# Patient Record
Sex: Female | Born: 1950 | Race: White | Hispanic: No | Marital: Married | Smoking: Never smoker
Health system: Southern US, Community
[De-identification: ages and names within clinical notes are randomized; demographics above are authoritative.]

## PROBLEM LIST (undated history)

## (undated) DIAGNOSIS — K5790 Diverticulosis of intestine, part unspecified, without perforation or abscess without bleeding: Secondary | ICD-10-CM

---

## 2019-03-02 ENCOUNTER — Emergency Department (HOSPITAL_BASED_OUTPATIENT_CLINIC_OR_DEPARTMENT_OTHER)
Admission: EM | Admit: 2019-03-02 | Discharge: 2019-03-02 | Disposition: A | Discharge status: Home / Self Care | Payer: Medicare Other | Attending: Emergency Medicine | Admitting: Emergency Medicine

## 2019-03-02 ENCOUNTER — Encounter (HOSPITAL_BASED_OUTPATIENT_CLINIC_OR_DEPARTMENT_OTHER): Payer: Self-pay | Admitting: Student

## 2019-03-02 ENCOUNTER — Other Ambulatory Visit: Payer: Self-pay

## 2019-03-02 ENCOUNTER — Emergency Department (HOSPITAL_BASED_OUTPATIENT_CLINIC_OR_DEPARTMENT_OTHER): Payer: Medicare Other

## 2019-03-02 DIAGNOSIS — K449 Diaphragmatic hernia without obstruction or gangrene: Secondary | ICD-10-CM | POA: Diagnosis not present

## 2019-03-02 DIAGNOSIS — R1032 Left lower quadrant pain: Secondary | ICD-10-CM | POA: Insufficient documentation

## 2019-03-02 HISTORY — DX: Diverticulosis of intestine, part unspecified, without perforation or abscess without bleeding: K57.90

## 2019-03-02 LAB — CBC WITH DIFFERENTIAL/PLATELET
Abs Immature Granulocytes: 0.03 10*3/uL (ref 0.00–0.07)
Basophils Absolute: 0 10*3/uL (ref 0.0–0.1)
Basophils Relative: 0 %
Eosinophils Absolute: 0.1 10*3/uL (ref 0.0–0.5)
Eosinophils Relative: 1 %
HCT: 46.9 % — ABNORMAL HIGH (ref 36.0–46.0)
Hemoglobin: 14.9 g/dL (ref 12.0–15.0)
Immature Granulocytes: 0 %
Lymphocytes Relative: 27 %
Lymphs Abs: 2.5 10*3/uL (ref 0.7–4.0)
MCH: 28.3 pg (ref 26.0–34.0)
MCHC: 31.8 g/dL (ref 30.0–36.0)
MCV: 89 fL (ref 80.0–100.0)
Monocytes Absolute: 0.5 10*3/uL (ref 0.1–1.0)
Monocytes Relative: 6 %
Neutro Abs: 6 10*3/uL (ref 1.7–7.7)
Neutrophils Relative %: 66 %
Platelets: 262 10*3/uL (ref 150–400)
RBC: 5.27 MIL/uL — ABNORMAL HIGH (ref 3.87–5.11)
RDW: 13 % (ref 11.5–15.5)
WBC: 9.2 10*3/uL (ref 4.0–10.5)
nRBC: 0 % (ref 0.0–0.2)

## 2019-03-02 LAB — COMPREHENSIVE METABOLIC PANEL
ALT: 23 U/L (ref 0–44)
AST: 21 U/L (ref 15–41)
Albumin: 4.3 g/dL (ref 3.5–5.0)
Alkaline Phosphatase: 59 U/L (ref 38–126)
Anion gap: 8 (ref 5–15)
BUN: 16 mg/dL (ref 8–23)
CO2: 27 mmol/L (ref 22–32)
Calcium: 9.1 mg/dL (ref 8.9–10.3)
Chloride: 105 mmol/L (ref 98–111)
Creatinine, Ser: 1.03 mg/dL — ABNORMAL HIGH (ref 0.44–1.00)
GFR calc Af Amer: >60 mL/min (ref >60)
GFR calc non Af Amer: 56 mL/min — ABNORMAL LOW (ref >60)
Glucose, Bld: 96 mg/dL (ref 70–99)
Potassium: 3.8 mmol/L (ref 3.5–5.1)
Sodium: 140 mmol/L (ref 135–145)
Total Bilirubin: 1.1 mg/dL (ref 0.3–1.2)
Total Protein: 7.5 g/dL (ref 6.5–8.1)

## 2019-03-02 LAB — URINALYSIS, ROUTINE W REFLEX MICROSCOPIC
Bilirubin Urine: NEGATIVE
Glucose, UA: NEGATIVE mg/dL
Hgb urine dipstick: NEGATIVE
Ketones, ur: NEGATIVE mg/dL
Leukocytes,Ua: NEGATIVE
Nitrite: NEGATIVE
Protein, ur: NEGATIVE mg/dL
Specific Gravity, Urine: >1.03 — ABNORMAL HIGH (ref 1.005–1.030)
pH: 5.5 (ref 5.0–8.0)

## 2019-03-02 LAB — LIPASE, BLOOD: Lipase: 41 U/L (ref 11–51)

## 2019-03-02 MED ORDER — ONDANSETRON HCL 4 MG/2ML IJ SOLN
4.0000 mg | Freq: Once | INTRAMUSCULAR | Status: AC
  Administered 2019-03-02: 17:00:00 4 mg via INTRAVENOUS
  Filled 2019-03-02: qty 2

## 2019-03-02 MED ORDER — METRONIDAZOLE 500 MG PO TABS: 500.0000 mg | ORAL_TABLET | Freq: Two times a day (BID) | ORAL | 0 refills | Status: AC

## 2019-03-02 MED ORDER — SODIUM CHLORIDE 0.9 % IV BOLUS
1000.0000 mL | Freq: Once | INTRAVENOUS | Status: AC
  Administered 2019-03-02: 1000 mL via INTRAVENOUS

## 2019-03-02 MED ORDER — HYDROCODONE-ACETAMINOPHEN 5-325 MG PO TABS: 1.0000 | ORAL_TABLET | Freq: Four times a day (QID) | ORAL | 0 refills | Status: AC | PRN

## 2019-03-02 MED ORDER — FENTANYL CITRATE (PF) 100 MCG/2ML IJ SOLN
25.0000 ug | Freq: Once | INTRAMUSCULAR | Status: AC
  Administered 2019-03-02: 17:00:00 25 ug via INTRAVENOUS
  Filled 2019-03-02: qty 2

## 2019-03-02 MED ORDER — IOHEXOL 300 MG/ML  SOLN
100.0000 mL | Freq: Once | INTRAMUSCULAR | Status: AC | PRN
  Administered 2019-03-02: 100 mL via INTRAVENOUS

## 2019-03-02 NOTE — ED Triage Notes (Signed)
Pt here after course of antibiotics for ear infection and she is now experiencing diarrhea and abd pain x 3 weeks

## 2019-03-02 NOTE — Discharge Instructions (Addendum)
You are seen in the emergency department today for abdominal pain.  Your labs were all fairly normal with the exception that your creatinine was a bit high, this may be due to dehydration, this should be rechecked by primary care within 1 to 2 weeks.  Your CT scan did not show obvious diverticulitis, this may be very mild or early diverticulitis.  We are placing you on Flagyl to take twice per day for the next 5 days.  Do not drink alcohol with this medication as it is severely dangerous.  We also send you home with Percocet.with severe pain.  -Percocet-this is a narcotic/controlled substance medication that has potential addicting qualities.  We recommend that you take 1-2 tablets every 6 hours as needed for severe pain.  Do not drive or operate heavy machinery when taking this medicine as it can be sedating. Do not drink alcohol or take other sedating medications when taking this medicine for safety reasons.  Keep this out of reach of small children.  Please be aware this medicine has Tylenol in it (325 mg/tab) do not exceed the maximum dose of Tylenol in a day per over the counter recommendations should you decide to supplement with Tylenol over the counter.   Please follow attached diet guidelines to help with diarrhea.  Please follow-up with your GI doctor within 3 days.  Return to the ER for new or worsening symptoms including but not limited to fever, bloody diarrhea, inability to keep fluids down, change in severity or quality of pain, or any other concerns.

## 2019-03-02 NOTE — ED Notes (Signed)
CT awaiting results of lab work prior to imaging with iv contrast per protocol

## 2019-03-02 NOTE — ED Notes (Signed)
Patient transported to CT 

## 2019-03-02 NOTE — ED Provider Notes (Signed)
[facility] EMERGENCY DEPARTMENT Provider Note   CSN: [id number] Arrival date & time: 03/02/19  1551    History   Chief Complaint Chief Complaint  Patient presents with   Abdominal Pain   Diarrhea    HPI Taylor Villanueva is a 68 y.o. female with a hx of HTN, hyperlipidemia, SAH, chronic back pain, diverticulosis, and prior c-section & cholecystectomy who presents to the ED with complaints of abdominal pain x 2-3 weeks. Patient states pain is located to the generalized abdomen w/ increased pain to the LLQ, initially pain was intermittent primarily after eating, over the past 48 hours has been constant. Current discomfort is an 8/10 in severity other than eating no alleviating/aggravating. No medications tried PTA. Notes associated watery diarrhea with several episodes per day, non bloody. Sxs onset after taking a few days of unknown abx (thinks this may have been augmentin) started by urgent care for possible ear infection- wax occluding TM, ultimately saw ENT- no signs of infection abx was discontinued. Has had continued sxs since stopping abx. Called her GI doctor today and was instructed to come to the ED. Has had some nausea which she attributes to severe pain at times, but no vomiting. Denies fever, chills, emesis, melena, hematochezia, or dysuria. No recent foreign travel of suspicious fluid/food intake. Hx of diverticulosis thinks this may be similar to prior diverticulitis.      HPI  Past Medical History:  Diagnosis Date   Diverticulosis     There are no active problems to display for this patient.   History reviewed. No pertinent surgical history.   OB History   No obstetric history on file.      Home Medications    Prior to Admission medications   Not on File    Family History No family history on file.  Social History Social History   Tobacco Use   Smoking status: Not on file  Substance Use Topics   Alcohol use: Not on file   Drug use:  Not on file     Allergies   Patient has no allergy information on record.   Review of Systems Review of Systems  Constitutional: Negative for chills and fever.  Respiratory: Negative for cough and shortness of breath.   Cardiovascular: Negative for chest pain.  Gastrointestinal: Positive for abdominal pain, diarrhea and nausea. Negative for anal bleeding, blood in stool, constipation and vomiting.  Genitourinary: Negative for dysuria and urgency.  All other systems reviewed and are negative.    Physical Exam Updated Vital Signs BP (!) 150/99    Pulse 73    Temp 98.9 F (37.2 C) (Oral)    LMP  (LMP Unknown)    SpO2 98%  RR: 16 breaths per minute.    Physical Exam Vitals signs and nursing note reviewed.  Constitutional:      General: She is not in acute distress.    Appearance: She is well-developed. She is not toxic-appearing.  HENT:     Head: Normocephalic and atraumatic.  Eyes:     General:        Right eye: No discharge.        Left eye: No discharge.     Conjunctiva/sclera: Conjunctivae normal.  Neck:     Musculoskeletal: Neck supple.  Cardiovascular:     Rate and Rhythm: Normal rate and regular rhythm.  Pulmonary:     Effort: Pulmonary effort is normal. No respiratory distress.     Breath sounds: Normal breath sounds. No wheezing,  rhonchi or rales.  Abdominal:     General: There is no distension.     Palpations: Abdomen is soft.     Tenderness: There is abdominal tenderness (moderate generalized w/ increased tenderness to the LLQ). There is no guarding or rebound.  Skin:    General: Skin is warm and dry.     Findings: No rash.  Neurological:     Mental Status: She is alert.     Comments: Clear speech.   Psychiatric:        Behavior: Behavior normal.    ED Treatments / Results  Labs (all labs ordered are listed, but only abnormal results are displayed) Labs Reviewed  CBC WITH DIFFERENTIAL/PLATELET - Abnormal; Notable for the following components:       Result Value   RBC 5.27 (*)    HCT 46.9 (*)    All other components within normal limits  COMPREHENSIVE METABOLIC PANEL - Abnormal; Notable for the following components:   Creatinine, Ser 1.03 (*)    GFR calc non Af Amer 56 (*)    All other components within normal limits  URINALYSIS, ROUTINE W REFLEX MICROSCOPIC - Abnormal; Notable for the following components:   Specific Gravity, Urine >1.030 (*)    All other components within normal limits  LIPASE, BLOOD    EKG None  Radiology Ct Abdomen Pelvis W Contrast  Result Date: 03/02/2019 CLINICAL DATA:  Abdominal pain and diarrhea. History of diverticulitis. EXAM: CT ABDOMEN AND PELVIS WITH CONTRAST TECHNIQUE: Multidetector CT imaging of the abdomen and pelvis was performed using the standard protocol following bolus administration of intravenous contrast. CONTRAST:  OMNIPAQUE IOHEXOL 300 MG/ML  SOLN COMPARISON:  None. FINDINGS: Lower chest: Lung bases are clear. Hepatobiliary: No focal hepatic lesion. Postcholecystectomy. No biliary dilatation. Pancreas: Pancreas is normal. No ductal dilatation. No pancreatic inflammation. Spleen: Normal spleen Adrenals/urinary tract: Adrenal glands and kidneys are normal. The ureters and bladder normal. Stomach/Bowel: Moderate hiatal hernia. Stomach, duodenum small-bowel normal. Appendix normal. Several scattered diverticula ascending and transverse colon. Scattered diverticula descending colon. Multiple diverticula through the sigmoid region. The sigmoid colon is mildly thickened likely secondary to nondistention. No clear evidence acute diverticulitis. Vascular/Lymphatic: Abdominal aorta is normal caliber with atherosclerotic calcification. There is no retroperitoneal or periportal lymphadenopathy. No pelvic lymphadenopathy. Reproductive: Round peripherally enhancing lesion within the uterine body measuring 19 mm is presumed a enhancing leiomyoma. Other: No free fluid. Musculoskeletal: No aggressive osseous  lesion. Posterior lumbar fusion. Spinal stimulation leads in the the central canal. IMPRESSION: 1. Multiple diverticula through the sigmoid colon region without clear evidence of acute diverticulitis. Difficult to exclude very early or very mild diverticulitis. 2. Peripherally enhancing round lesion in the uterine body is presumed a leiomyoma. 3.  Aortic Atherosclerosis (ICD10-I70.0). 4. Moderate hiatal hernia. Electronically Signed   By: Genevive Bi M.D.   On: 03/02/2019 18:27    Procedures Procedures (including critical care time)  Medications Ordered in ED Medications - No data to display   Initial Impression / Assessment and Plan / ED Course  I have reviewed the triage vital signs and the nursing notes.  Pertinent labs & imaging results that were available during my care of the patient were reviewed by me and considered in my medical decision making (see chart for details).   Patient presents to the ED w/ complaints of abdominal pain associated with watery diarrhea x 2-3 weeks. Nontoxic appearing, no apparent distress, vitals WNL with the exception of mildly elevated BP- doubt HTN emergency, PCP  recheck. Exam w/ moderate generalized abdominal tenderness to palpation w/ increased tenderness to the LLQ, no peritoneal signs. DDX: Diverticulitis,colitis, bowel perf/obstruction, appendicitis, pancreatitis, also considering c. Diff given recent abx but seems somewhat less likely. Plan for labs & CT abdomen/pelvis. Analgesics, anti-emetics, & fluids ordered.   Work-up reviewed:  CBC: No leukocytosis or significant anemia CMP: Mild elevation in creatinine, no old on record for comparison, PCP recheck.  No significant electrolyte disturbance.  LFTs WNL. Lipase: Within normal limits Urinalysis: No signs of infection, elevated specific gravity consistent with dehydration, receiving fluids in the ER. C. difficile testing: Patient unable to provide sample in the emergency department, feel this is  somewhat less likely. CT scan: Difficult to exclude very early or very mild diverticulitis, there are also findings of a fibroid as well as aortic atherosclerosis and a hiatal hernia.  Findings and plan of care discussed with supervising physician Dr. Silverio LayYao who has evaluated patient- recommends discharge home with 500 mg twice daily Flagyl for 5 days. Will also provide a few tablets of hydrocodone for pain control. [place] Cont[place] Sub[place] System queried, prescription printed secondary to unsure if pharmacy is open that patient utilizes. Follow up with her GI doctor. I discussed results, treatment plan, need for follow-up, and return precautions with the patient. Provided opportunity for questions, patient confirmed understanding and is in agreement with plan.     Final Clinical Impressions(s) / ED Diagnoses   Final diagnoses:  Left lower quadrant abdominal pain    ED Discharge Orders         Ordered    metroNIDAZOLE (FLAGYL) 500 MG tablet  2 times daily     03/02/19 1919    HYDROcodone-acetaminophen (NORCO/VICODIN) 5-325 MG tablet  Every 6 hours PRN     03/02/19 1919           Cherly Andersonetrucelli, Ramona Ruark R, PA-C 03/02/19 1921    Charlynne PanderYao, David Hsienta, MD 03/03/19 [phone]

## 2019-07-24 ENCOUNTER — Other Ambulatory Visit: Payer: Self-pay

## 2019-07-24 ENCOUNTER — Emergency Department (HOSPITAL_BASED_OUTPATIENT_CLINIC_OR_DEPARTMENT_OTHER): Payer: Medicare Other

## 2019-07-24 ENCOUNTER — Emergency Department (HOSPITAL_BASED_OUTPATIENT_CLINIC_OR_DEPARTMENT_OTHER)
Admission: EM | Admit: 2019-07-24 | Discharge: 2019-07-24 | Disposition: A | Discharge status: Home / Self Care | Payer: Medicare Other | Attending: Emergency Medicine | Admitting: Emergency Medicine

## 2019-07-24 ENCOUNTER — Encounter (HOSPITAL_BASED_OUTPATIENT_CLINIC_OR_DEPARTMENT_OTHER): Payer: Self-pay | Admitting: Emergency Medicine

## 2019-07-24 DIAGNOSIS — K5732 Diverticulitis of large intestine without perforation or abscess without bleeding: Secondary | ICD-10-CM | POA: Insufficient documentation

## 2019-07-24 DIAGNOSIS — R1032 Left lower quadrant pain: Secondary | ICD-10-CM | POA: Diagnosis present

## 2019-07-24 DIAGNOSIS — Z79899 Other long term (current) drug therapy: Secondary | ICD-10-CM | POA: Diagnosis not present

## 2019-07-24 LAB — URINALYSIS, MICROSCOPIC (REFLEX)

## 2019-07-24 LAB — CBC WITH DIFFERENTIAL/PLATELET
Abs Immature Granulocytes: 0.02 10*3/uL (ref 0.00–0.07)
Basophils Absolute: 0 10*3/uL (ref 0.0–0.1)
Basophils Relative: 0 %
Eosinophils Absolute: 0.1 10*3/uL (ref 0.0–0.5)
Eosinophils Relative: 1 %
HCT: 45.7 % (ref 36.0–46.0)
Hemoglobin: 14.6 g/dL (ref 12.0–15.0)
Immature Granulocytes: 0 %
Lymphocytes Relative: 30 %
Lymphs Abs: 2.2 10*3/uL (ref 0.7–4.0)
MCH: 28.4 pg (ref 26.0–34.0)
MCHC: 31.9 g/dL (ref 30.0–36.0)
MCV: 88.9 fL (ref 80.0–100.0)
Monocytes Absolute: 0.5 10*3/uL (ref 0.1–1.0)
Monocytes Relative: 7 %
Neutro Abs: 4.5 10*3/uL (ref 1.7–7.7)
Neutrophils Relative %: 62 %
Platelets: 246 10*3/uL (ref 150–400)
RBC: 5.14 MIL/uL — ABNORMAL HIGH (ref 3.87–5.11)
RDW: 12.8 % (ref 11.5–15.5)
WBC: 7.3 10*3/uL (ref 4.0–10.5)
nRBC: 0 % (ref 0.0–0.2)

## 2019-07-24 LAB — COMPREHENSIVE METABOLIC PANEL
ALT: 15 U/L (ref 0–44)
AST: 19 U/L (ref 15–41)
Albumin: 4.6 g/dL (ref 3.5–5.0)
Alkaline Phosphatase: 64 U/L (ref 38–126)
Anion gap: 9 (ref 5–15)
BUN: 11 mg/dL (ref 8–23)
CO2: 27 mmol/L (ref 22–32)
Calcium: 9.6 mg/dL (ref 8.9–10.3)
Chloride: 103 mmol/L (ref 98–111)
Creatinine, Ser: 0.99 mg/dL (ref 0.44–1.00)
GFR calc Af Amer: >60 mL/min (ref >60)
GFR calc non Af Amer: 58 mL/min — ABNORMAL LOW (ref >60)
Glucose, Bld: 95 mg/dL (ref 70–99)
Potassium: 3.8 mmol/L (ref 3.5–5.1)
Sodium: 139 mmol/L (ref 135–145)
Total Bilirubin: 1.1 mg/dL (ref 0.3–1.2)
Total Protein: 7.5 g/dL (ref 6.5–8.1)

## 2019-07-24 LAB — URINALYSIS, ROUTINE W REFLEX MICROSCOPIC
Bilirubin Urine: NEGATIVE
Glucose, UA: NEGATIVE mg/dL
Hgb urine dipstick: NEGATIVE
Ketones, ur: NEGATIVE mg/dL
Nitrite: NEGATIVE
Protein, ur: NEGATIVE mg/dL
Specific Gravity, Urine: 1.01 (ref 1.005–1.030)
pH: 6 (ref 5.0–8.0)

## 2019-07-24 LAB — LIPASE, BLOOD: Lipase: 30 U/L (ref 11–51)

## 2019-07-24 MED ORDER — AMOXICILLIN-POT CLAVULANATE 875-125 MG PO TABS: 1.0000 | ORAL_TABLET | Freq: Two times a day (BID) | ORAL | 0 refills | Status: AC

## 2019-07-24 MED ORDER — IOHEXOL 300 MG/ML  SOLN
100.0000 mL | Freq: Once | INTRAMUSCULAR | Status: AC | PRN
  Administered 2019-07-24: 14:00:00 100 mL via INTRAVENOUS

## 2019-07-24 MED ORDER — SODIUM CHLORIDE 0.9 % IV BOLUS
1000.0000 mL | Freq: Once | INTRAVENOUS | Status: AC
  Administered 2019-07-24: 1000 mL via INTRAVENOUS

## 2019-07-24 MED ORDER — ONDANSETRON HCL 4 MG/2ML IJ SOLN
4.0000 mg | Freq: Once | INTRAMUSCULAR | Status: AC
  Administered 2019-07-24: 12:00:00 4 mg via INTRAVENOUS
  Filled 2019-07-24: qty 2

## 2019-07-24 MED ORDER — MORPHINE SULFATE (PF) 4 MG/ML IV SOLN
4.0000 mg | Freq: Once | INTRAVENOUS | Status: AC
  Administered 2019-07-24: 12:00:00 4 mg via INTRAVENOUS
  Filled 2019-07-24: qty 1

## 2019-07-24 NOTE — ED Triage Notes (Signed)
Generalized abdominal pain since Sunday.  Fever since last night.  Some Diarrhea.

## 2019-07-24 NOTE — Discharge Instructions (Signed)
Take Augmentin as prescribed and complete the full course.  Return to the ER for new or worsening symptoms.  Follow-up with your primary care provider in the next week for recheck.

## 2019-07-24 NOTE — ED Provider Notes (Signed)
[facility] EMERGENCY DEPARTMENT Provider Note   CSN: [id number] Arrival date & time: 07/24/19  1034     History   Chief Complaint Chief Complaint  Patient presents with  . Abdominal Pain    HPI Taylor Villanueva is a 68 y.o. female.     68 yo female with history of diverticulitis presents with complaint of lower abdominal pain x 2 days with onset fever (100.2) last night. Pain is worse with movement, transitioning from sitting to standing, feels similar to prior diverticulitis. Reports intermittent non bloody diarrhea, no changes in bowel habits, denies nausea/vomiting. Last colonoscopy was 1 year ago. Prior abdominal surgeries include c-section. No other complaints or concerns.      Past Medical History:  Diagnosis Date  . Diverticulosis     There are no active problems to display for this patient.   History reviewed. No pertinent surgical history.   OB History   No obstetric history on file.      Home Medications    Prior to Admission medications   Medication Sig Start Date End Date Taking? Authorizing Provider  amoxicillin-clavulanate (AUGMENTIN) 875-125 MG tablet Take 1 tablet by mouth every 12 (twelve) hours for 7 days. 07/24/19 07/31/19  Jeannie Fend, PA-C  HYDROcodone-acetaminophen (NORCO/VICODIN) 5-325 MG tablet Take 1 tablet by mouth every 6 (six) hours as needed for severe pain. 03/02/19   Petrucelli, Samantha R, PA-C  metroNIDAZOLE (FLAGYL) 500 MG tablet Take 1 tablet (500 mg total) by mouth 2 (two) times daily. 03/02/19   Petrucelli, Pleas Koch, PA-C    Family History No family history on file.  Social History Social History   Tobacco Use  . Smoking status: Never Smoker  . Smokeless tobacco: Never Used  Substance Use Topics  . Alcohol use: Yes    Frequency: Never  . Drug use: Never     Allergies   Oxycodone-acetaminophen, Codeine, Morphine and related, Oxycodone, and Tramadol   Review of Systems Review of Systems   Constitutional: Positive for fever.  Respiratory: Negative for shortness of breath.   Cardiovascular: Negative for chest pain.  Gastrointestinal: Positive for abdominal pain and diarrhea. Negative for blood in stool, constipation, nausea and vomiting.  Genitourinary: Negative for difficulty urinating, dysuria and frequency.  Musculoskeletal: Negative for arthralgias, back pain and myalgias.  Skin: Negative for rash and wound.  Allergic/Immunologic: Negative for immunocompromised state.  Neurological: Negative for weakness.  Hematological: Negative for adenopathy.  Psychiatric/Behavioral: Negative for confusion.  All other systems reviewed and are negative.    Physical Exam Updated Vital Signs BP 119/72 (BP Location: Right Arm)   Pulse (!) 59   Temp 98.3 F (36.8 C) (Oral)   Resp 16   Ht 5' (1.524 m)   Wt 65.8 kg   LMP  (LMP Unknown)   SpO2 99%   BMI 28.32 kg/m   Physical Exam Vitals signs and nursing note reviewed.  Constitutional:      General: She is not in acute distress.    Appearance: She is well-developed. She is not diaphoretic.  HENT:     Head: Normocephalic and atraumatic.  Cardiovascular:     Rate and Rhythm: Normal rate and regular rhythm.     Heart sounds: Normal heart sounds.  Pulmonary:     Effort: Pulmonary effort is normal.     Breath sounds: Normal breath sounds.  Abdominal:     Palpations: Abdomen is soft.     Tenderness: There is abdominal tenderness in the suprapubic area, left  upper quadrant and left lower quadrant. There is no right CVA tenderness or left CVA tenderness.  Skin:    General: Skin is warm and dry.     Findings: No rash.  Neurological:     Mental Status: She is alert and oriented to person, place, and time.  Psychiatric:        Behavior: Behavior normal.      ED Treatments / Results  Labs (all labs ordered are listed, but only abnormal results are displayed) Labs Reviewed  CBC WITH DIFFERENTIAL/PLATELET - Abnormal;  Notable for the following components:      Result Value   RBC 5.14 (*)    All other components within normal limits  COMPREHENSIVE METABOLIC PANEL - Abnormal; Notable for the following components:   GFR calc non Af Amer 58 (*)    All other components within normal limits  URINALYSIS, ROUTINE W REFLEX MICROSCOPIC - Abnormal; Notable for the following components:   Leukocytes,Ua TRACE (*)    All other components within normal limits  URINALYSIS, MICROSCOPIC (REFLEX) - Abnormal; Notable for the following components:   Bacteria, UA RARE (*)    All other components within normal limits  LIPASE, BLOOD    EKG None  Radiology Ct Abdomen Pelvis W Contrast  Result Date: 07/24/2019 CLINICAL DATA:  68 year old female with abdominal pain. Concern for acute diverticulitis. EXAM: CT ABDOMEN AND PELVIS WITH CONTRAST TECHNIQUE: Multidetector CT imaging of the abdomen and pelvis was performed using the standard protocol following bolus administration of intravenous contrast. CONTRAST:  162mL OMNIPAQUE IOHEXOL 300 MG/ML  SOLN COMPARISON:  CT of the abdomen pelvis dated 03/02/2019 FINDINGS: Lower chest: The visualized lung bases are clear. No intra-abdominal free air or free fluid. Hepatobiliary: Slight heterogeneous enhancement of the liver. No intrahepatic biliary ductal dilatation. Cholecystectomy. No retained calcified stone noted in the central CBD. Pancreas: Unremarkable. No pancreatic ductal dilatation or surrounding inflammatory changes. Spleen: Normal in size without focal abnormality. Adrenals/Urinary Tract: The adrenal glands are unremarkable. Mild bilateral renal parenchyma atrophy. There is no hydronephrosis on either side. There is symmetric enhancement and excretion of contrast by both kidneys. 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 The urinary bladder is grossly unremarkable. Stomach/Bowel: There is sigmoid diverticulosis with muscular hypertrophy. There is  inflammatory changes of the sigmoid colon consistent with acute diverticulitis. No diverticular abscess or perforation. Additional scattered colonic diverticula noted. There is a small hiatal hernia. There is no bowel obstruction. The appendix is normal. Vascular/Lymphatic: Mild aortoiliac atherosclerotic disease. The IVC is unremarkable. No portal venous gas. There is no adenopathy. Reproductive: The uterus is anteverted. There is a 2 cm anterior body fibroid. No adnexal masses. Other: Spinal stimulator with battery pack in the subcutaneous soft tissues of the right gluteal region. Musculoskeletal: Grade 1 L4-L5 anterolisthesis. There is disc spacer and posterior fusion at this level. No acute osseous pathology. IMPRESSION: Sigmoid diverticulitis. No abscess or perforation. Aortic Atherosclerosis (ICD10-I70.0). Electronically Signed   By: Anner Crete M.D.   On: 07/24/2019 14:14    Procedures Procedures (including critical care time)  Medications Ordered in ED Medications  ondansetron (ZOFRAN) injection 4 mg (4 mg Intravenous Given 07/24/19 1149)  morphine 4 MG/ML injection 4 mg (4 mg Intravenous Given 07/24/19 1149)  sodium chloride 0.9 % bolus 1,000 mL (1,000 mLs Intravenous New Bag/Given 07/24/19 1154)  iohexol (OMNIPAQUE) 300 MG/ML solution 100 mL (100 mLs Intravenous Contrast Given 07/24/19 1358)     Initial Impression / Assessment and  Plan / ED Course  I have reviewed the triage vital signs and the nursing notes.  Pertinent labs & imaging results that were available during my care of the patient were reviewed by me and considered in my medical decision making (see chart for details).  Clinical Course as of Jul 23 1430  Wed Jul 24, 2019  [id number] 68 year old female presents with complaint of left side abdominal pain with temp of 100.2 last night, history of diverticulitis, concern for same.  On exam patient is well-appearing, has mild to moderate tenderness left side of her abdomen.  Lab  work shows no significant abnormalities to the CBC, CMP, lipase, UA.  CT shows uncomplicated sigmoid diverticulitis.  Patient be treated with Augmentin, has her pain medication at home to take as needed.  Recommend recheck with PCP in the next week, return to ER for new or worsening symptoms.   [LM]    Clinical Course User Index [LM] Jeannie FendMurphy, Jayah Balthazar A, PA-C      Final Clinical Impressions(s) / ED Diagnoses   Final diagnoses:  Sigmoid diverticulitis    ED Discharge Orders         Ordered    amoxicillin-clavulanate (AUGMENTIN) 875-125 MG tablet  Every 12 hours     07/24/19 1429           Jeannie FendMurphy, Nakyla Bracco A, PA-C 07/24/19 1432    Milagros Lollykstra, Richard S, MD 07/25/19 [id number]

## 2019-08-30 ENCOUNTER — Other Ambulatory Visit: Payer: Self-pay

## 2019-08-30 ENCOUNTER — Emergency Department (HOSPITAL_BASED_OUTPATIENT_CLINIC_OR_DEPARTMENT_OTHER): Payer: Medicare Other

## 2019-08-30 ENCOUNTER — Emergency Department (HOSPITAL_BASED_OUTPATIENT_CLINIC_OR_DEPARTMENT_OTHER)
Admission: EM | Admit: 2019-08-30 | Discharge: 2019-08-30 | Disposition: A | Discharge status: Home / Self Care | Payer: Medicare Other | Attending: Emergency Medicine | Admitting: Emergency Medicine

## 2019-08-30 ENCOUNTER — Encounter (HOSPITAL_BASED_OUTPATIENT_CLINIC_OR_DEPARTMENT_OTHER): Payer: Self-pay | Admitting: Emergency Medicine

## 2019-08-30 DIAGNOSIS — Z79899 Other long term (current) drug therapy: Secondary | ICD-10-CM | POA: Diagnosis not present

## 2019-08-30 DIAGNOSIS — M5442 Lumbago with sciatica, left side: Secondary | ICD-10-CM | POA: Diagnosis not present

## 2019-08-30 DIAGNOSIS — M5441 Lumbago with sciatica, right side: Secondary | ICD-10-CM | POA: Diagnosis not present

## 2019-08-30 DIAGNOSIS — M545 Low back pain: Secondary | ICD-10-CM | POA: Diagnosis present

## 2019-08-30 MED ORDER — ONDANSETRON HCL 4 MG/2ML IJ SOLN
4.0000 mg | Freq: Once | INTRAMUSCULAR | Status: AC
  Administered 2019-08-30: 4 mg via INTRAVENOUS
  Filled 2019-08-30: qty 2

## 2019-08-30 MED ORDER — LIDOCAINE 5 % EX PTCH: 1.0000 | MEDICATED_PATCH | CUTANEOUS | 0 refills | Status: AC

## 2019-08-30 MED ORDER — HYDROMORPHONE HCL 1 MG/ML IJ SOLN
0.5000 mg | Freq: Once | INTRAMUSCULAR | Status: AC
  Administered 2019-08-30: 19:00:00 0.5 mg via INTRAVENOUS
  Filled 2019-08-30: qty 1

## 2019-08-30 MED ORDER — FENTANYL CITRATE (PF) 100 MCG/2ML IJ SOLN
50.0000 ug | Freq: Once | INTRAMUSCULAR | Status: AC
  Administered 2019-08-30: 50 ug via INTRAVENOUS
  Filled 2019-08-30: qty 2

## 2019-08-30 NOTE — ED Provider Notes (Signed)
[facility] EMERGENCY DEPARTMENT Provider Note   CSN: [id number] Arrival date & time: 08/30/19  1513     History   Chief Complaint Chief Complaint  Patient presents with  . Back Pain    HPI Taylor CornersSherry Bolyard is a 68 y.o. female.     Patient is a 68 year old female with history of chronic back pain associated with prior lumbar fusion who presents with back pain.  She states that about 2 days ago she was pushed over by a dog and hurt her lower back.  She has had worsening pain to her lower back over her lower lumbar spine and radiating around both sides of her lower back.  There is some radiation down her legs as well.  It goes down both of her legs.  She has had intermittent radiation of pain down her legs previously when she has flareups of her back pain.  She denies any numbness or weakness of her legs.  No loss of bowel or bladder function.  No fevers.     Past Medical History:  Diagnosis Date  . Diverticulosis     There are no active problems to display for this patient.   History reviewed. No pertinent surgical history.   OB History   No obstetric history on file.      Home Medications    Prior to Admission medications   Medication Sig Start Date End Date Taking? Authorizing Provider  famotidine (PEPCID) 40 MG tablet TAKE 1 TABLET(40 MG) BY MOUTH DAILY 08/14/19  Yes [provider]  pravastatin (PRAVACHOL) 80 MG tablet TAKE 1 TABLET BY MOUTH EVERY DAY 03/03/15  Yes [provider]  primidone (MYSOLINE) 50 MG tablet Two BID 09/30/18 08/14/20 Yes [provider]  gabapentin (NEURONTIN) 300 MG capsule Take 600 mg by mouth 3 (three) times daily. 06/17/19   [provider]  HYDROcodone-acetaminophen (NORCO/VICODIN) 5-325 MG tablet Take 1 tablet by mouth every 6 (six) hours as needed for severe pain. 03/02/19   Petrucelli, Pleas KochSamantha R, PA-C  levETIRAcetam (KEPPRA) 500 MG tablet  06/17/19   [provider]  lidocaine  (LIDODERM) 5 % Place 1 patch onto the skin daily. Remove & Discard patch within 12 hours or as directed by MD 08/30/19   Rolan BuccoBelfi, Zannie Runkle, MD  metoprolol succinate (TOPROL-XL) 50 MG 24 hr tablet Take 50 mg by mouth daily. 05/27/19   [provider]  metroNIDAZOLE (FLAGYL) 500 MG tablet Take 1 tablet (500 mg total) by mouth 2 (two) times daily. 03/02/19   Petrucelli, Pleas KochSamantha R, PA-C    Family History History reviewed. No pertinent family history.  Social History Social History   Tobacco Use  . Smoking status: Never Smoker  . Smokeless tobacco: Never Used  Substance Use Topics  . Alcohol use: Yes    Frequency: Never  . Drug use: Never     Allergies   Oxycodone-acetaminophen, Codeine, Morphine and related, Oxycodone, and Tramadol   Review of Systems Review of Systems  Constitutional: Negative for fever.  Gastrointestinal: Negative for nausea and vomiting.  Musculoskeletal: Positive for back pain. Negative for joint swelling and neck pain.  Skin: Negative for wound.  Neurological: Negative for weakness, numbness and headaches.     Physical Exam Updated Vital Signs BP 117/64 (BP Location: Right Arm)   Pulse 68   Temp 98.4 F (36.9 C) (Oral)   Resp 18   Ht 5' (1.524 m)   Wt 65.8 kg   LMP  (LMP Unknown)   SpO2  98%   BMI 28.32 kg/m   Physical Exam Constitutional:      Appearance: She is well-developed.  HENT:     Head: Normocephalic and atraumatic.  Neck:     Musculoskeletal: Normal range of motion and neck supple.  Cardiovascular:     Rate and Rhythm: Normal rate.  Pulmonary:     Effort: Pulmonary effort is normal.  Musculoskeletal:        General: Tenderness present.     Comments: Positive tenderness to the L4-L5 lumbar region overlying the prior surgical incision.  There is also some tenderness in the musculature bilaterally in the lower lumbar region.  Negative straight leg raise bilaterally.  Pedal pulses are intact.  She has normal sensation and motor  function to the lower extremities bilaterally.  Skin:    General: Skin is warm and dry.  Neurological:     Mental Status: She is alert and oriented to person, place, and time.      ED Treatments / Results  Labs (all labs ordered are listed, but only abnormal results are displayed) Labs Reviewed - No data to display  EKG None  Radiology Dg Lumbar Spine Complete  Result Date: 08/30/2019 CLINICAL DATA:  Lower back pain after fall today. EXAM: LUMBAR SPINE - COMPLETE 4+ VIEW COMPARISON:  July 24, 2019. FINDINGS: Status post surgical posterior fusion of L4-5. Minimal grade 1 anterolisthesis of L3-4 is noted secondary to posterior facet joint hypertrophy. Moderate degenerative disc disease is noted at L1-2 and L3-4 with anterior osteophyte formation. No fracture is noted. IMPRESSION: Status post surgical posterior fusion of L4-5. Minimal grade 1 anterolisthesis of L3-4 secondary to posterior facet joint hypertrophy. No acute abnormality seen in the lumbar spine. Aortic Atherosclerosis (ICD10-I70.0). Electronically Signed   By: Lupita Raider M.D.   On: 08/30/2019 17:54    Procedures Procedures (including critical care time)  Medications Ordered in ED Medications  fentaNYL (SUBLIMAZE) injection 50 mcg (50 mcg Intravenous Given 08/30/19 1732)  ondansetron (ZOFRAN) injection 4 mg (4 mg Intravenous Given 08/30/19 1730)  HYDROmorphone (DILAUDID) injection 0.5 mg (0.5 mg Intravenous Given 08/30/19 1855)     Initial Impression / Assessment and Plan / ED Course  I have reviewed the triage vital signs and the nursing notes.  Pertinent labs & imaging results that were available during my care of the patient were reviewed by me and considered in my medical decision making (see chart for details).        Patient is a 68 year old female who presents with exacerbation of her chronic back pain.  This happened after recent fall.  X-rays do not show any evidence of acute abnormalities or  bony injuries.  She is neurologically intact with no suggestions of cauda equina.  She was given fentanyl and Dilaudid in the ED although she said it was of limited benefit.  However she does not want to wait any longer and is ready to go home.  She is on hydrocodone 10 mg tablets at home and recently got a prescription for 60 tablets on November 12.  I did not feel appropriate to change her home pain regimen.  However I did offer her to add Lidoderm patches which she will try.  I encouraged her to call her pain management physician on Monday.  Return precautions were given.  Final Clinical Impressions(s) / ED Diagnoses   Final diagnoses:  Acute bilateral low back pain with bilateral sciatica    ED Discharge Orders  Ordered    lidocaine (LIDODERM) 5 %  Every 24 hours     08/30/19 1951           Malvin Johns, MD 08/30/19 [phone]

## 2019-08-30 NOTE — ED Triage Notes (Signed)
Pt reports abd pain, back pain, neck and hip pain since the dog knocked her over a couple days ago. Hx of diverticulitis, chronic back pain with stimulator. Takes hydrocodone at home with some relief but pain returns. Unsure if she hit her head.

## 2021-01-16 IMAGING — CT CT ABD-PELV W/ CM
2 of 5 series · 16 of 46 positions shown, 18 images · IV contrast (Omnipaque)
Comparison: CT of the abdomen pelvis dated 03/02/2019

CLINICAL DATA: 68-year-old female with abdominal pain. Concern for
acute diverticulitis.

EXAM:
CT ABDOMEN AND PELVIS WITH CONTRAST
TECHNIQUE: Multidetector CT imaging of the abdomen and pelvis was performed
using the standard protocol following bolus administration of
intravenous contrast.
CONTRAST:  100mL OMNIPAQUE IOHEXOL 300 MG/ML  SOLN

[Series 2: axial st · axial · 0.93mm/px · z∈[+541,+971]mm · 13 of 96 slices shown, 15 images]
[im 5/96  soft-tissue]
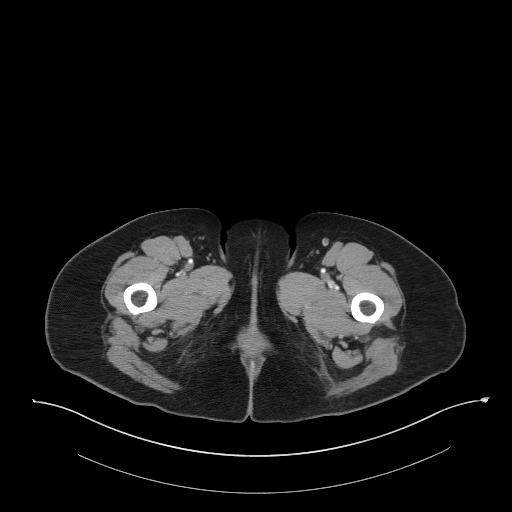
[im 5/96  bone]
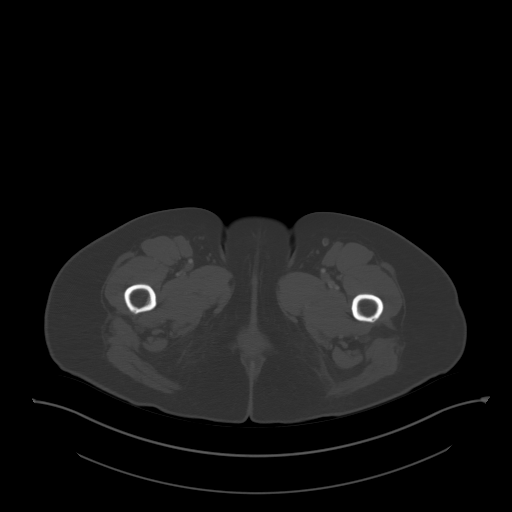
[im 15/96  soft-tissue]
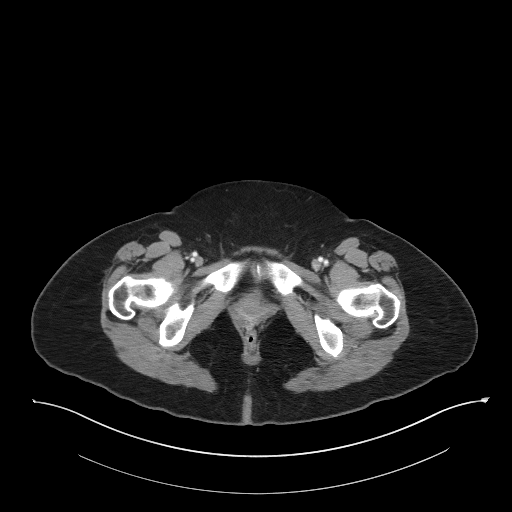
[im 20/96  soft-tissue]
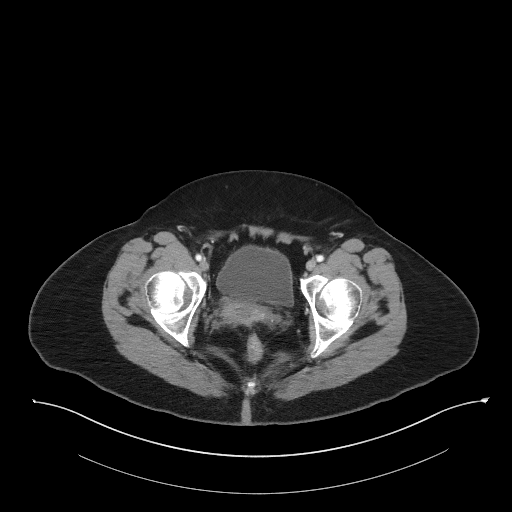
[im 29/96  soft-tissue]
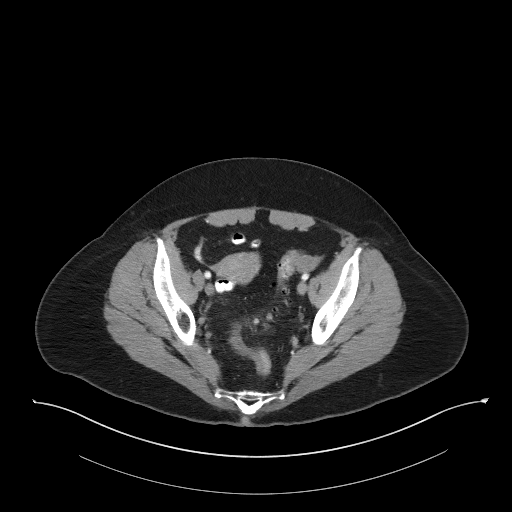
[im 34/96  soft-tissue]
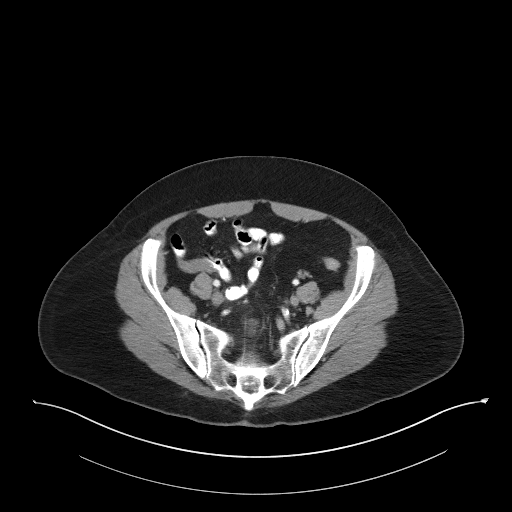
[im 43/96  soft-tissue]
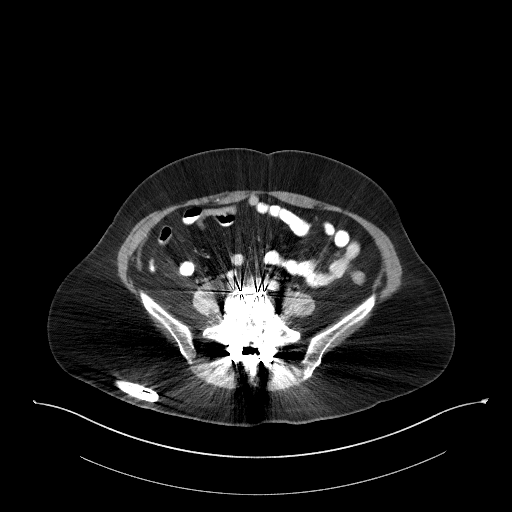
[im 48/96  soft-tissue]
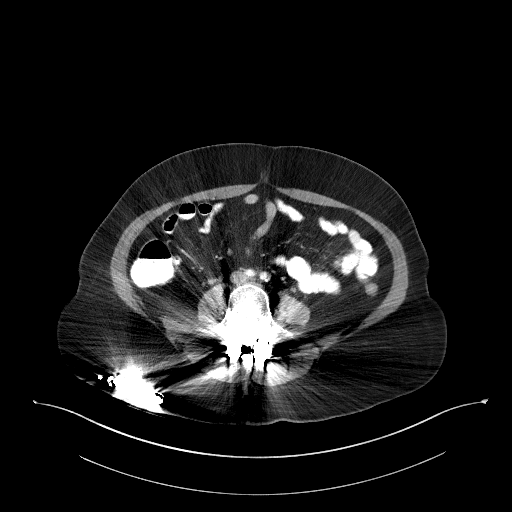
[im 53/96  soft-tissue]
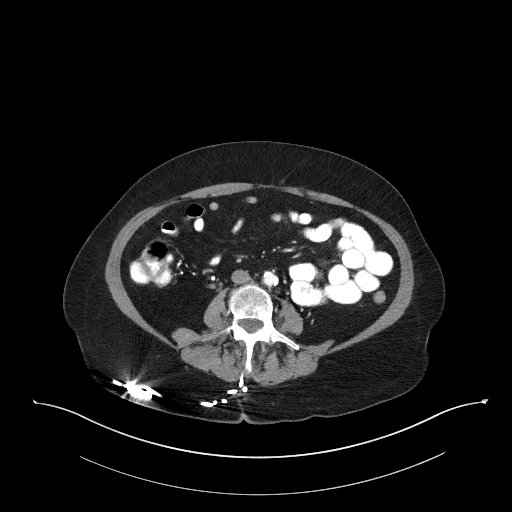
[im 62/96  soft-tissue]
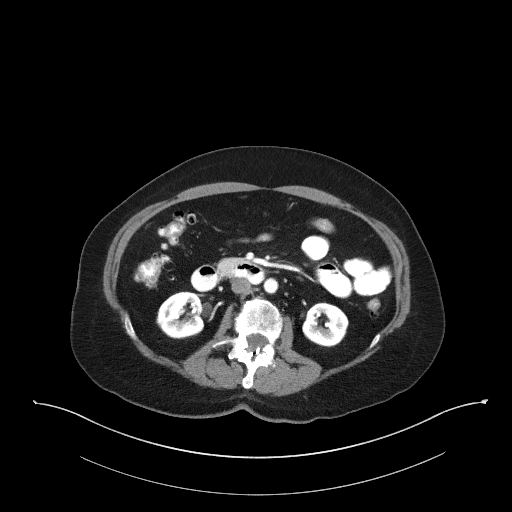
[im 62/96  bone]
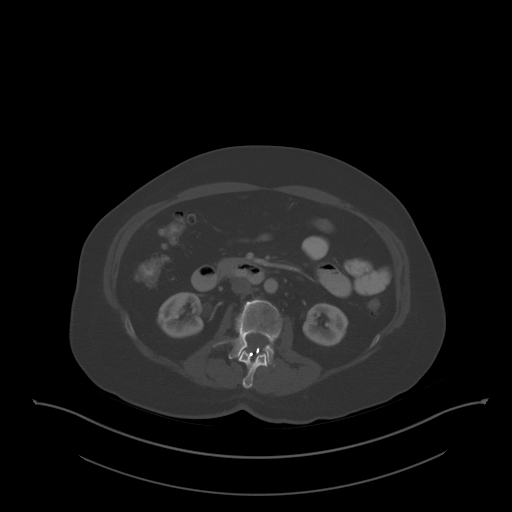
[im 67/96  soft-tissue]
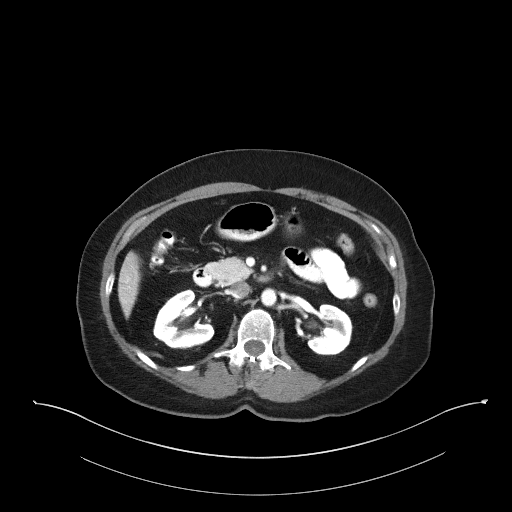
[im 77/96  soft-tissue]
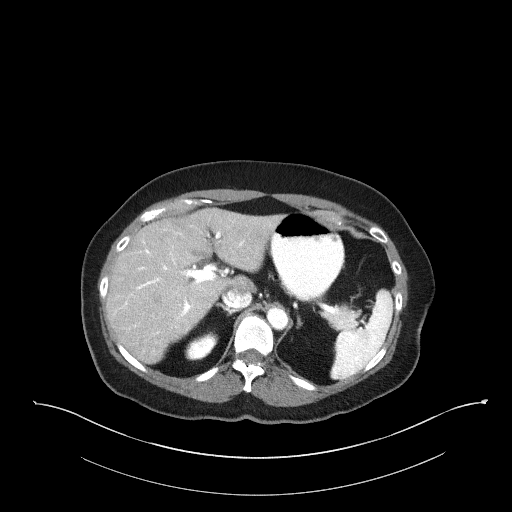
[im 81/96  soft-tissue]
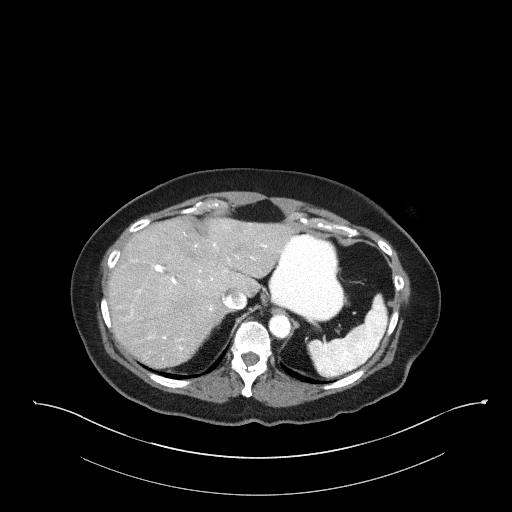
[im 91/96  soft-tissue]
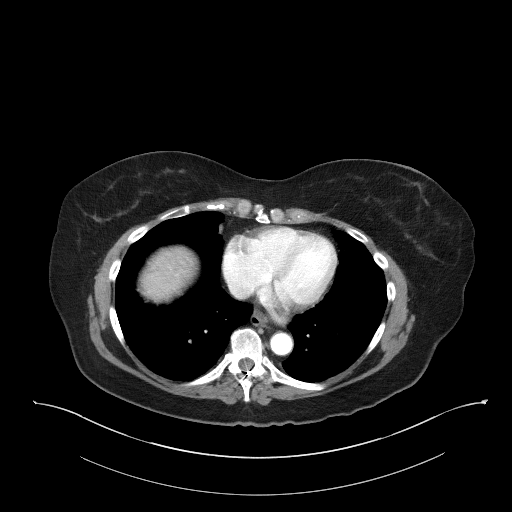

[Series 5: coronal st · coronal · 0.75mm/px · 3 of 88 slices shown]
[im 30/88  soft-tissue]
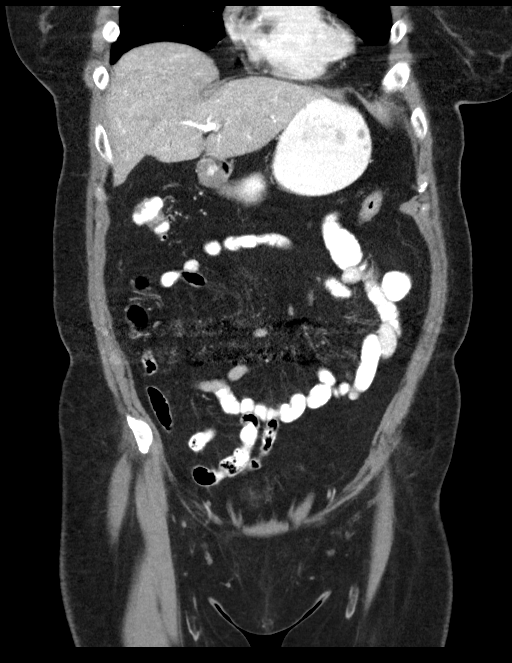
[im 39/88  soft-tissue]
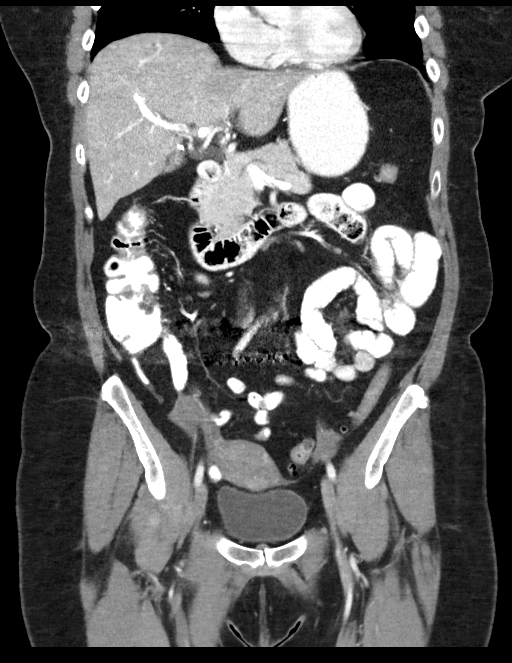
[im 49/88  soft-tissue]
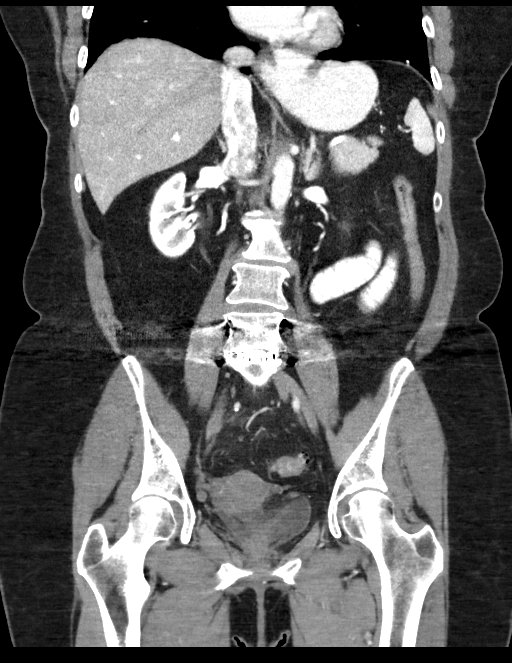

[16 of 46 positions shown; findings below may reference images not displayed]

FINDINGS: Lower chest: The visualized lung bases are clear.

No intra-abdominal free air or free fluid.

Hepatobiliary: Slight heterogeneous enhancement of the liver. No
intrahepatic biliary ductal dilatation. Cholecystectomy. No retained
calcified stone noted in the central CBD.

Pancreas: Unremarkable. No pancreatic ductal dilatation or
surrounding inflammatory changes.

Spleen: Normal in size without focal abnormality.

Adrenals/Urinary Tract: The adrenal glands are unremarkable. Mild
bilateral renal parenchyma atrophy. There is no hydronephrosis on
either side. There is symmetric enhancement and excretion of
contrast by both kidneys. There is duplication of the right renal
collecting system and proximal ureters. The urinary bladder is
grossly unremarkable.

Stomach/Bowel: There is sigmoid diverticulosis with muscular
hypertrophy. There is inflammatory changes of the sigmoid colon
consistent with acute diverticulitis. No diverticular abscess or
perforation. Additional scattered colonic diverticula noted. There
is a small hiatal hernia. There is no bowel obstruction. The
appendix is normal.

Vascular/Lymphatic: Mild aortoiliac atherosclerotic disease. The IVC
is unremarkable. No portal venous gas. There is no adenopathy.

Reproductive: The uterus is anteverted. There is a 2 cm anterior
body fibroid. No adnexal masses.

Other: Spinal stimulator with battery pack in the subcutaneous soft
tissues of the right gluteal region.

Musculoskeletal: Grade 1 L4-L5 anterolisthesis. There is disc spacer
and posterior fusion at this level. No acute osseous pathology.
IMPRESSION: Sigmoid diverticulitis. No abscess or perforation.

Aortic Atherosclerosis ([id number]-GD4.4).
